# Patient Record
Sex: Male | Born: 1975 | Race: Black or African American | Hispanic: No | Marital: Married
Health system: Southern US, Community
[De-identification: ages and names within clinical notes are randomized; demographics above are authoritative.]

---

## 1998-05-03 ENCOUNTER — Emergency Department (HOSPITAL_COMMUNITY): Admission: EM | Admit: 1998-05-03 | Discharge: 1998-05-03 | Payer: Self-pay | Admitting: Emergency Medicine

## 1998-05-17 ENCOUNTER — Emergency Department (HOSPITAL_COMMUNITY): Admission: EM | Admit: 1998-05-17 | Discharge: 1998-05-18 | Payer: Self-pay | Admitting: Emergency Medicine

## 1998-11-10 ENCOUNTER — Emergency Department (HOSPITAL_COMMUNITY): Admission: EM | Admit: 1998-11-10 | Discharge: 1998-11-10 | Payer: Self-pay | Admitting: Internal Medicine

## 1998-11-10 ENCOUNTER — Encounter: Payer: Self-pay | Admitting: Internal Medicine

## 2000-04-20 ENCOUNTER — Emergency Department (HOSPITAL_COMMUNITY): Admission: EM | Admit: 2000-04-20 | Discharge: 2000-04-20 | Payer: Self-pay | Admitting: Emergency Medicine

## 2003-05-14 ENCOUNTER — Encounter: Payer: Self-pay | Admitting: Emergency Medicine

## 2003-05-14 ENCOUNTER — Emergency Department (HOSPITAL_COMMUNITY): Admission: EM | Admit: 2003-05-14 | Discharge: 2003-05-14 | Payer: Self-pay | Admitting: Emergency Medicine

## 2003-05-19 ENCOUNTER — Emergency Department (HOSPITAL_COMMUNITY): Admission: EM | Admit: 2003-05-19 | Discharge: 2003-05-19 | Payer: Self-pay | Admitting: Emergency Medicine

## 2005-09-26 ENCOUNTER — Emergency Department (HOSPITAL_COMMUNITY): Admission: EM | Admit: 2005-09-26 | Discharge: 2005-09-26 | Payer: Self-pay | Admitting: Emergency Medicine

## 2013-10-01 ENCOUNTER — Other Ambulatory Visit (HOSPITAL_COMMUNITY): Payer: Self-pay | Admitting: Internal Medicine

## 2013-10-01 ENCOUNTER — Ambulatory Visit (HOSPITAL_COMMUNITY)
Admission: RE | Admit: 2013-10-01 | Discharge: 2013-10-01 | Disposition: A | Discharge status: Home / Self Care | Payer: BC Managed Care – PPO | Source: Ambulatory Visit | Attending: Internal Medicine | Admitting: Internal Medicine

## 2013-10-01 DIAGNOSIS — M549 Dorsalgia, unspecified: Secondary | ICD-10-CM

## 2013-10-01 DIAGNOSIS — R51 Headache: Secondary | ICD-10-CM | POA: Insufficient documentation

## 2013-10-01 DIAGNOSIS — M542 Cervicalgia: Secondary | ICD-10-CM | POA: Insufficient documentation

## 2014-11-20 IMAGING — CR DG CERVICAL SPINE COMPLETE 4+V
5 series · 5 of 5 positions shown · non-contrast
Comparison: None.

CLINICAL DATA: Pain for 4 years with headaches

EXAM:
CERVICAL SPINE  4+ VIEWS

[w cervical spine lat]
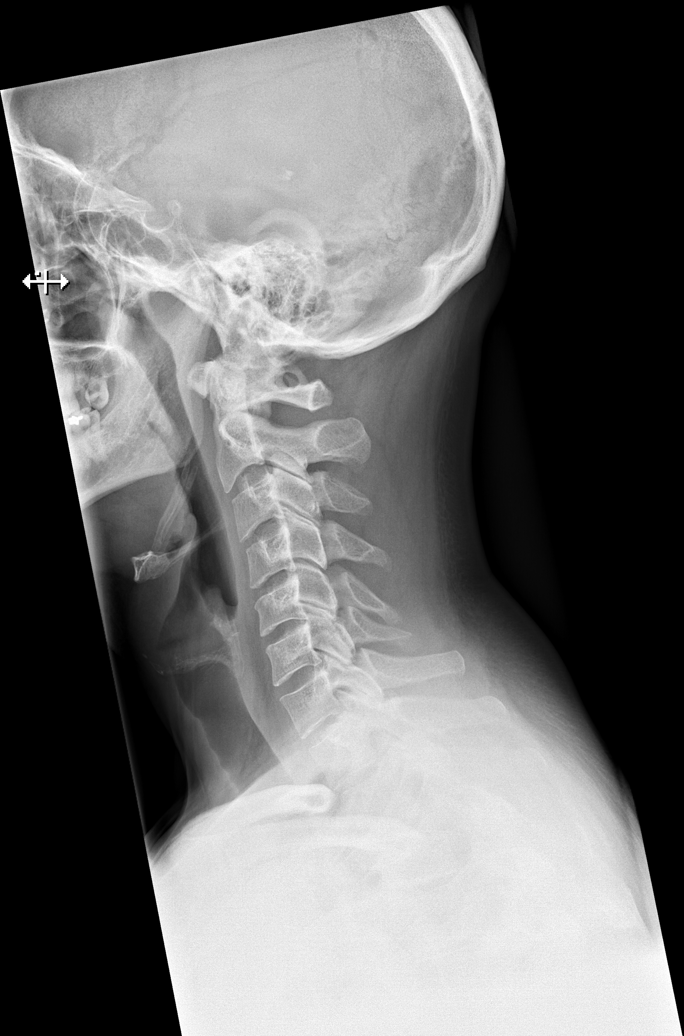

[w cervical spine ap_obl (1 of 2)]
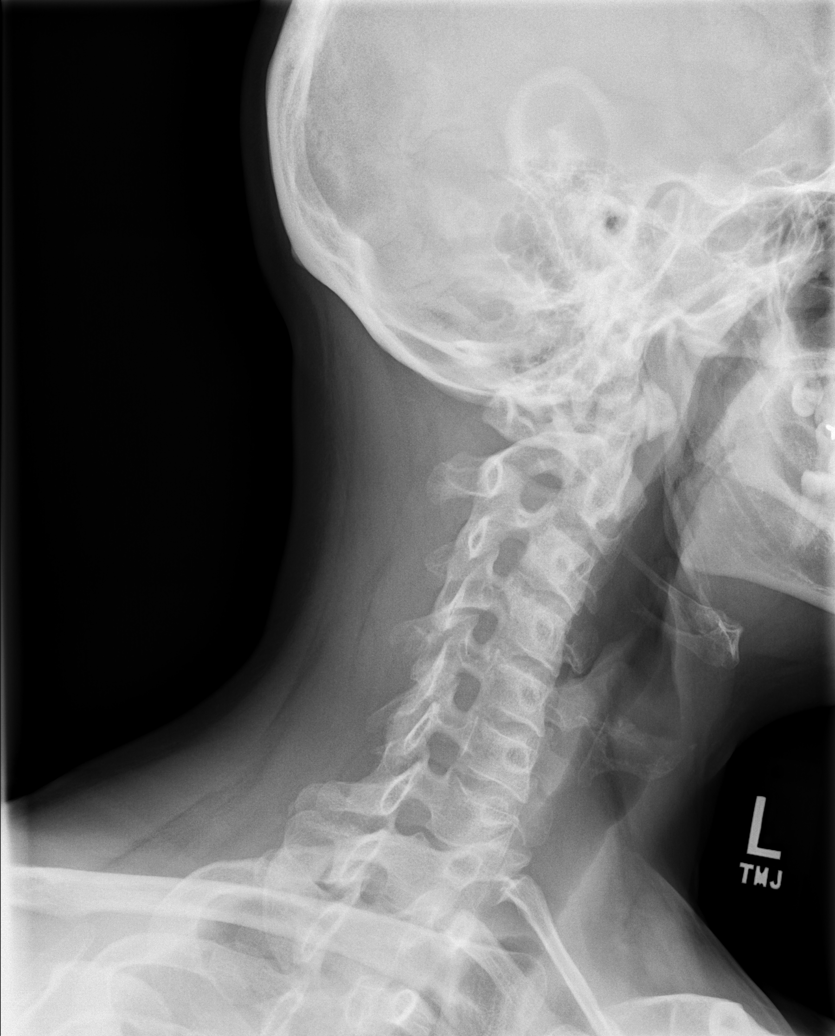

[w cervical spine ap_obl (2 of 2)]
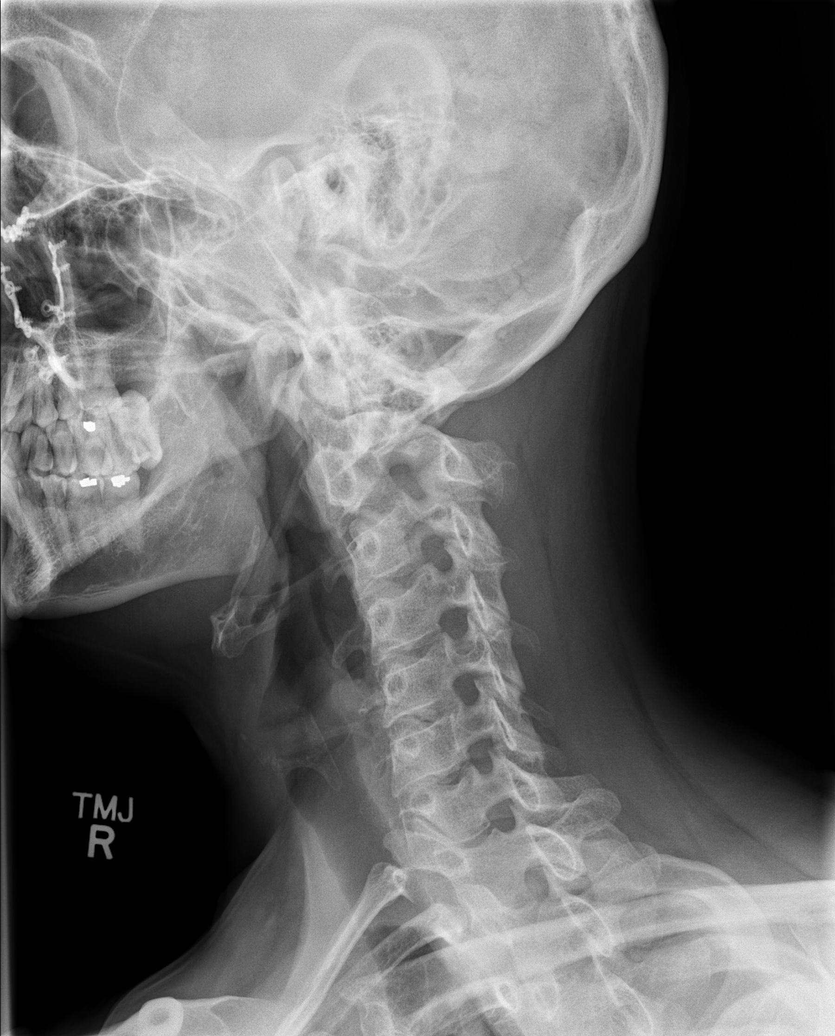

[w cervical spine ap]
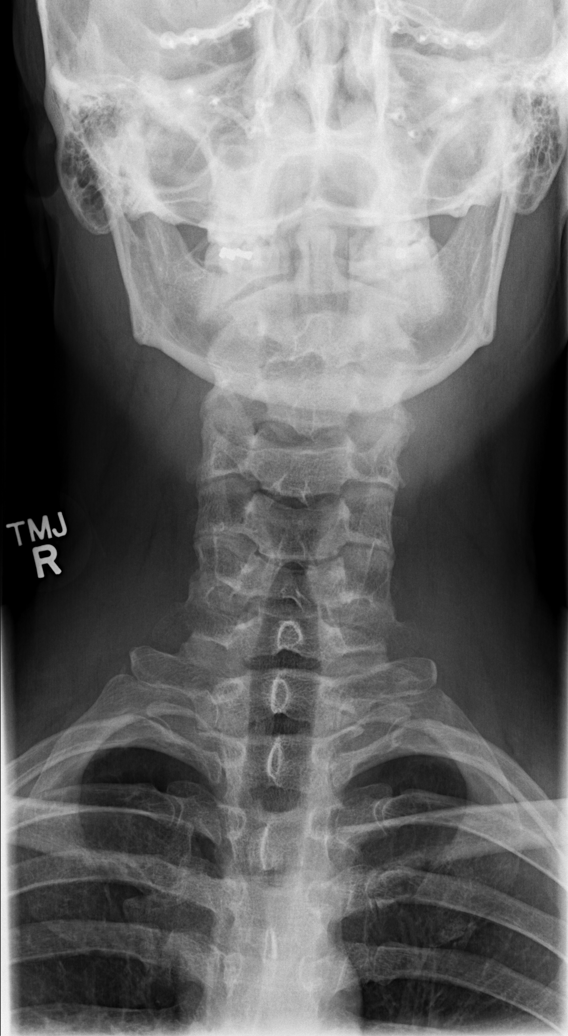

[w cervical spine odontoid]
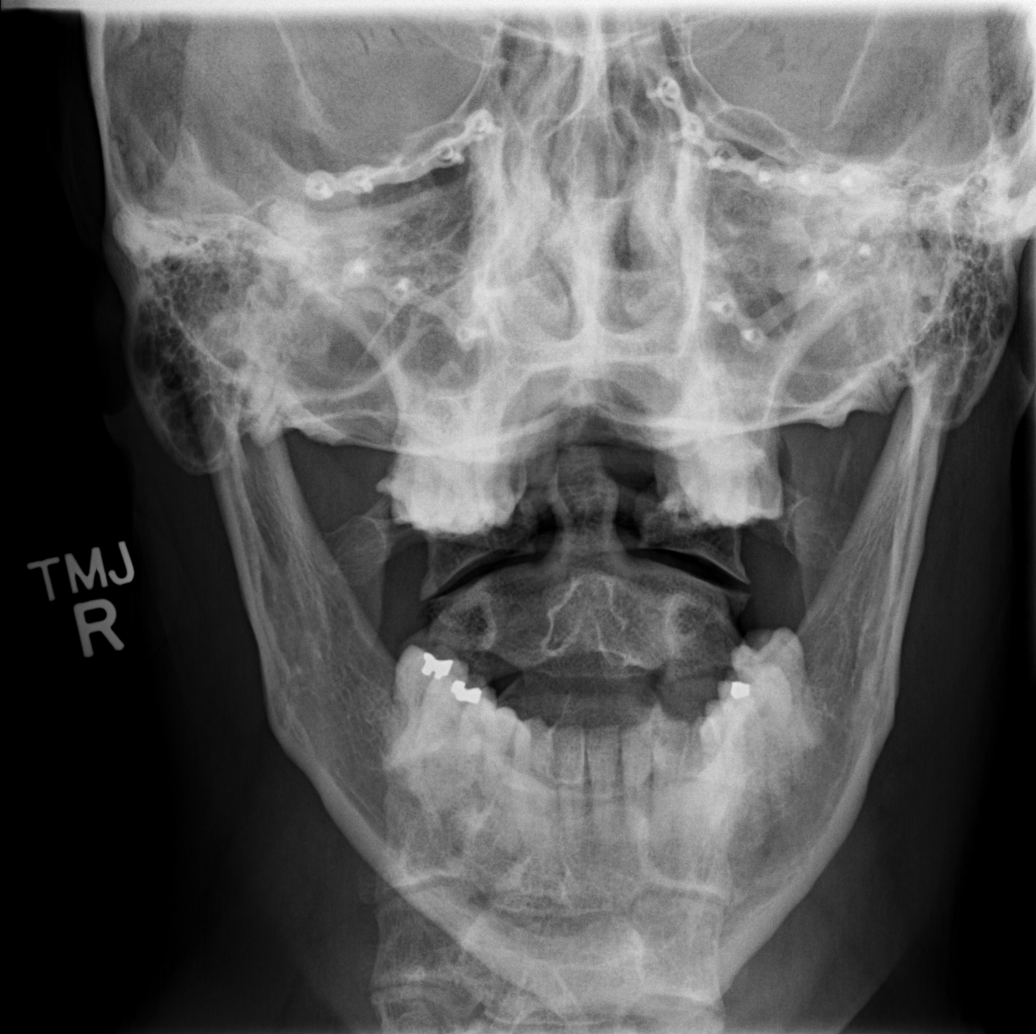

[5 of 5 positions shown; findings below may reference images not displayed]

FINDINGS: There is no evidence of cervical spine fracture or prevertebral soft
tissue swelling. Alignment is normal. No other significant bone
abnormalities are identified.
IMPRESSION: Negative cervical spine radiographs.

## 2015-04-07 ENCOUNTER — Emergency Department (HOSPITAL_COMMUNITY)
Admission: EM | Admit: 2015-04-07 | Discharge: 2015-04-07 | Discharge status: Against Medical Advice | Payer: BLUE CROSS/BLUE SHIELD | Attending: Emergency Medicine | Admitting: Emergency Medicine

## 2015-04-07 DIAGNOSIS — X58XXXA Exposure to other specified factors, initial encounter: Secondary | ICD-10-CM | POA: Diagnosis not present

## 2015-04-07 DIAGNOSIS — Y998 Other external cause status: Secondary | ICD-10-CM | POA: Insufficient documentation

## 2015-04-07 DIAGNOSIS — R4189 Other symptoms and signs involving cognitive functions and awareness: Secondary | ICD-10-CM

## 2015-04-07 DIAGNOSIS — Y9389 Activity, other specified: Secondary | ICD-10-CM | POA: Diagnosis not present

## 2015-04-07 DIAGNOSIS — Y9289 Other specified places as the place of occurrence of the external cause: Secondary | ICD-10-CM | POA: Diagnosis not present

## 2015-04-07 DIAGNOSIS — R55 Syncope and collapse: Secondary | ICD-10-CM | POA: Diagnosis not present

## 2015-04-07 DIAGNOSIS — T507X1A Poisoning by analeptics and opioid receptor antagonists, accidental (unintentional), initial encounter: Secondary | ICD-10-CM | POA: Diagnosis present

## 2015-04-07 LAB — COMPREHENSIVE METABOLIC PANEL
ALT: 23 U/L (ref 17–63)
ANION GAP: 8 (ref 5–15)
AST: 35 U/L (ref 15–41)
Albumin: 4.2 g/dL (ref 3.5–5.0)
Alkaline Phosphatase: 55 U/L (ref 38–126)
BILIRUBIN TOTAL: 0.6 mg/dL (ref 0.3–1.2)
BUN: 15 mg/dL (ref 6–20)
CALCIUM: 9.1 mg/dL (ref 8.9–10.3)
CO2: 29 mmol/L (ref 22–32)
CREATININE: 1.03 mg/dL (ref 0.61–1.24)
Chloride: 101 mmol/L (ref 101–111)
GFR calc Af Amer: >60 mL/min (ref >60)
GFR calc non Af Amer: >60 mL/min (ref >60)
Glucose, Bld: 172 mg/dL — ABNORMAL HIGH (ref 65–99)
Potassium: 4 mmol/L (ref 3.5–5.1)
SODIUM: 138 mmol/L (ref 135–145)
TOTAL PROTEIN: 7.1 g/dL (ref 6.5–8.1)

## 2015-04-07 LAB — CBC
HCT: 45.4 % (ref 39.0–52.0)
HEMOGLOBIN: 15.2 g/dL (ref 13.0–17.0)
MCH: 31.1 pg (ref 26.0–34.0)
MCHC: 33.5 g/dL (ref 30.0–36.0)
MCV: 93 fL (ref 78.0–100.0)
Platelets: 340 10*3/uL (ref 150–400)
RBC: 4.88 MIL/uL (ref 4.22–5.81)
RDW: 13.5 % (ref 11.5–15.5)
WBC: 14.8 10*3/uL — ABNORMAL HIGH (ref 4.0–10.5)

## 2015-04-07 LAB — CBG MONITORING, ED: Glucose-Capillary: 160 mg/dL — ABNORMAL HIGH (ref 65–99)

## 2015-04-07 LAB — SALICYLATE LEVEL: Salicylate Lvl: <4 mg/dL (ref 2.8–30.0)

## 2015-04-07 LAB — ACETAMINOPHEN LEVEL: Acetaminophen (Tylenol), Serum: <10 ug/mL — ABNORMAL LOW (ref 10–30)

## 2015-04-07 LAB — ETHANOL: Alcohol, Ethyl (B): <5 mg/dL (ref <5)

## 2015-04-07 NOTE — ED Notes (Signed)
Patient refused further evaluation and treatment and requested to leave immediatly , risks explained.  Dr. Gilmore LarocheBelfie informed.  Patient signed release and escorted out by charge nurse.

## 2015-04-07 NOTE — ED Provider Notes (Signed)
CSN: [id number]     Arrival date & time 04/07/15  1445 History   None    Chief Complaint  Patient presents with  . Drug Overdose     (Consider location/radiation/quality/duration/timing/severity/associated sxs/prior Treatment) Patient is a 39 y.o. male presenting with altered mental status.  Altered Mental Status Presenting symptoms: unresponsiveness   Severity:  Moderate Most recent episode:  Today Episode history:  Single Duration: unknown. Timing: unknown. Progression:  Resolved (after narcan given by EMS) Context: not alcohol use, not drug use (pt denied), not head injury, not homeless, not a nursing home resident, not a recent change in medication, not a recent illness and not a recent infection   Associated symptoms: no abdominal pain, normal movement, no agitation, no bladder incontinence, no decreased appetite, no depression, no difficulty breathing, no eye deviation, no fever, no hallucinations, no headaches, no light-headedness, no nausea, no palpitations, no seizures, no slurred speech, no suicidal behavior, no visual change, no vomiting and no weakness     No past medical history on file. No past surgical history on file. No family history on file. History  Substance Use Topics  . Smoking status: Not on file  . Smokeless tobacco: Not on file  . Alcohol Use: Not on file    Review of Systems  Constitutional: Negative for fever, chills, appetite change, fatigue and decreased appetite.  HENT: Negative for congestion, ear pain, facial swelling, mouth sores and sore throat.   Eyes: Negative for visual disturbance.  Respiratory: Negative for cough, chest tightness and shortness of breath.   Cardiovascular: Negative for chest pain and palpitations.  Gastrointestinal: Negative for nausea, vomiting, abdominal pain, diarrhea and blood in stool.  Endocrine: Negative for cold intolerance and heat intolerance.  Genitourinary: Negative for bladder incontinence, frequency,  decreased urine volume and difficulty urinating.  Musculoskeletal: Negative for back pain and neck stiffness.  Skin: Negative for wound.  Neurological: Negative for dizziness, seizures, weakness, light-headedness and headaches.  Psychiatric/Behavioral: Negative for hallucinations and agitation.  All other systems reviewed and are negative.     Allergies  Review of patient's allergies indicates no known allergies.  Home Medications   Prior to Admission medications   Medication Sig Start Date End Date Taking? Authorizing Provider  ibuprofen (ADVIL,MOTRIN) 200 MG tablet Take 400-800 mg by mouth every 6 (six) hours as needed for headache or mild pain.   Yes Historical Provider, MD   BP 134/87 mmHg  Pulse 116  Temp(Src) 100.1 F (37.8 C) (Oral)  Resp 17  SpO2 98% Physical Exam  Constitutional: He is oriented to person, place, and time. He appears well-nourished. No distress.  HENT:  Head: Normocephalic and atraumatic.  Right Ear: External ear normal.  Left Ear: External ear normal.  Eyes: Pupils are equal, round, and reactive to light. Right eye exhibits no discharge. Left eye exhibits no discharge. No scleral icterus.  Neck: Normal range of motion. Neck supple.  Cardiovascular: Normal rate.  Exam reveals no gallop and no friction rub.   No murmur heard. Pulmonary/Chest: Effort normal and breath sounds normal. No stridor. No respiratory distress. He has no wheezes. He has no rales. He exhibits no tenderness.  Abdominal: Soft. He exhibits no distension and no mass. There is no tenderness. There is no rebound and no guarding.  Musculoskeletal: He exhibits no edema or tenderness.  Neurological: He is alert and oriented to person, place, and time. He has normal strength. He displays no atrophy and no tremor. No cranial nerve deficit or sensory  deficit. He exhibits normal muscle tone. He displays no seizure activity. Coordination normal. GCS eye subscore is 4. GCS verbal subscore is 5.  GCS motor subscore is 6.  Reflex Scores:      Bicep reflexes are 2+ on the right side and 2+ on the left side.      Brachioradialis reflexes are 2+ on the right side and 2+ on the left side.      Patellar reflexes are 2+ on the right side and 2+ on the left side. Skin: Skin is warm and dry. No rash noted. He is not diaphoretic. No erythema.    ED Course  Procedures (including critical care time) Labs Review Labs Reviewed  CBC - Abnormal; Notable for the following:    WBC 14.8 (*)    All other components within normal limits  COMPREHENSIVE METABOLIC PANEL - Abnormal; Notable for the following:    Glucose, Bld 172 (*)    All other components within normal limits  ACETAMINOPHEN LEVEL - Abnormal; Notable for the following:    Acetaminophen (Tylenol), Serum <10 (*)    All other components within normal limits  CBG MONITORING, ED - Abnormal; Notable for the following:    Glucose-Capillary 160 (*)    All other components within normal limits  ETHANOL  SALICYLATE LEVEL    Imaging Review No results found.   EKG Interpretation None      MDM   39 year old male was brought in by EMS after being found in a vehicle unresponsive. Patient responded to 1 Narcan dose. He remained hemodynamically stable in route. On arrival here patient's ABCs were intact. Patient was alert and oriented 4. He denied any substance abuse and was elusive when providing history. Screening labs were obtained patient provided with IV fluids. Prior to reassessment patient signed out AGAINST MEDICAL ADVICE.  Patient seen in conjunction with Dr. Fredderick Phenix.  Deniece Portela, MD. Resident  Final diagnoses:  Saundra Shelling, MD 04/08/15 1610  Rolan Bucco, MD 04/11/15 [phone]

## 2015-04-07 NOTE — ED Notes (Signed)
Pt pulled out IV and pulled off monitor signed out AMA

## 2015-04-07 NOTE — ED Notes (Signed)
CBG 160 

## 2015-04-07 NOTE — ED Notes (Signed)
Per EMS: pt found unresponsive in car by bystander, pt noted to have snoring respirations upon fire department arrival. NPA inserted by fire department, EMS administered 1mg  of narcan per arrival and respirations increased. Pt alert and oriented at this time, no NPA noted. Pt declined to give substance ingested if any. Nad noted.
